# Patient Record
Sex: Male | Born: 1995 | Race: White | Hispanic: No | Marital: Single | Smoking: Never smoker
Health system: Southern US, Community
[De-identification: ages and names within clinical notes are randomized; demographics above are authoritative.]

## PROBLEM LIST (undated history)

## (undated) DIAGNOSIS — K219 Gastro-esophageal reflux disease without esophagitis: Secondary | ICD-10-CM

## (undated) HISTORY — PX: TONSILLECTOMY AND ADENOIDECTOMY: SUR1326

## (undated) HISTORY — PX: MOUTH SURGERY: SHX715

## (undated) HISTORY — DX: Gastro-esophageal reflux disease without esophagitis: K21.9

## (undated) HISTORY — PX: ROTATOR CUFF REPAIR: SHX139

---
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
## 2014-01-17 ENCOUNTER — Other Ambulatory Visit: Payer: Self-pay | Admitting: Family Medicine

## 2014-01-18 NOTE — Telephone Encounter (Signed)
Last seen 07/05/13  B Oxford

## 2014-01-29 ENCOUNTER — Ambulatory Visit (INDEPENDENT_AMBULATORY_CARE_PROVIDER_SITE_OTHER): Payer: Commercial Indemnity | Admitting: Family Medicine

## 2014-01-29 VITALS — BP 110/67 | HR 55 | Temp 99.3°F | Ht 66.0 in | Wt 228.0 lb

## 2014-01-29 DIAGNOSIS — L0390 Cellulitis, unspecified: Secondary | ICD-10-CM

## 2014-01-29 DIAGNOSIS — L0291 Cutaneous abscess, unspecified: Secondary | ICD-10-CM

## 2014-01-29 MED ORDER — DOXYCYCLINE HYCLATE 100 MG PO TABS: 100.0000 mg | ORAL_TABLET | Freq: Two times a day (BID) | ORAL | Status: DC

## 2014-01-29 NOTE — Progress Notes (Signed)
   Subjective:    Patient ID: Delories HeinzMatthew D Coppock, male    DOB: 08/16/1996, 18 y.o.   MRN: [id number]  HPI  This 18 y.o. male presents for evaluation of cyst or acne on right face.  Review of Systems No chest pain, SOB, HA, dizziness, vision change, N/V, diarrhea, constipation, dysuria, urinary urgency or frequency, myalgias, arthralgias or rash.     Objective:   Physical Exam   Vital signs noted  Well developed well nourished male.  HEENT - Head atraumatic Normocephalic                Eyes - PERRLA, Conjuctiva - clear Sclera- Clear EOMI                Ears - EAC's Wnl TM's Wnl Gross Hearing WNL                Throat - oropharanx wnl Respiratory - Lungs CTA bilateral Cardiac - RRR S1 and S2 without murmur Skin - cystic acne right face     Assessment & Plan:  Cellulitis - Plan: doxycycline (VIBRA-TABS) 100 MG tablet one po bid x 10 days #20  Deatra CanterWilliam J Oxford FNP

## 2014-03-01 ENCOUNTER — Other Ambulatory Visit: Payer: Self-pay | Admitting: Family Medicine

## 2014-06-05 ENCOUNTER — Other Ambulatory Visit: Payer: Self-pay | Admitting: Family Medicine

## 2014-07-01 ENCOUNTER — Other Ambulatory Visit: Payer: Self-pay | Admitting: Family Medicine

## 2014-07-02 ENCOUNTER — Other Ambulatory Visit: Payer: Self-pay | Admitting: *Deleted

## 2014-07-02 MED ORDER — OMEPRAZOLE 20 MG PO CPDR: DELAYED_RELEASE_CAPSULE | ORAL | Status: DC

## 2014-07-31 ENCOUNTER — Other Ambulatory Visit: Payer: Self-pay | Admitting: Family Medicine

## 2015-04-25 ENCOUNTER — Ambulatory Visit (INDEPENDENT_AMBULATORY_CARE_PROVIDER_SITE_OTHER): Payer: Commercial Indemnity | Admitting: Physician Assistant

## 2015-04-25 ENCOUNTER — Encounter: Payer: Self-pay | Admitting: Physician Assistant

## 2015-04-25 ENCOUNTER — Ambulatory Visit (INDEPENDENT_AMBULATORY_CARE_PROVIDER_SITE_OTHER): Payer: Commercial Indemnity

## 2015-04-25 VITALS — Temp 97.5°F | Ht 66.0 in | Wt 214.6 lb

## 2015-04-25 DIAGNOSIS — M79672 Pain in left foot: Secondary | ICD-10-CM | POA: Diagnosis not present

## 2015-04-25 DIAGNOSIS — S99922A Unspecified injury of left foot, initial encounter: Secondary | ICD-10-CM | POA: Diagnosis not present

## 2015-04-25 MED ORDER — MELOXICAM 7.5 MG PO TABS: 7.5000 mg | ORAL_TABLET | Freq: Every day | ORAL | Status: DC

## 2015-04-25 NOTE — Patient Instructions (Signed)
Foot Contusion ° A foot contusion is a deep bruise to the foot. Contusions happen when an injury causes bleeding under the skin. Signs of bruising include pain, puffiness (swelling), and discolored skin. The contusion may turn blue, purple, or yellow. °HOME CARE °· Put ice on the injured area. °¨ Put ice in a plastic bag. °¨ Place a towel between your skin and the bag. °¨ Leave the ice on for 15-20 minutes, 03-04 times a day. °· Only take medicines as told by your doctor. °· Use an elastic wrap only as told. You may remove the wrap for sleeping, showering, and bathing. Take the wrap off if you lose feeling (numb) in your toes, or they turn blue or cold. Put the wrap on more loosely. °· Keep the foot raised (elevated) with pillows. °· If your foot hurts, avoid standing or walking. °· When your doctor says it is okay to use your foot, start using it slowly. If you have pain, lessen how much you use your foot. °· See your doctor as told. °GET HELP RIGHT AWAY IF:  °· You have more redness, puffiness, or pain in your foot. °· Your puffiness or pain does not get better with medicine. °· You lose feeling in your foot, or you cannot move your toes. °· Your foot turns cold or blue. °· You have pain when you move your toes. °· Your foot feels warm. °· Your contusion does not get better in 2 days. °MAKE SURE YOU:  °· Understand these instructions. °· Will watch this condition. °· Will get help right away if you or your child is not doing well or gets worse. °Document Released: 06/23/2008 Document Revised: 03/15/2012 Document Reviewed: 08/18/2011 °ExitCare® Patient Information ©2015 [employer]. This information is not intended to replace advice given to you by your health care provider. Make sure you discuss any questions you have with your health care provider. ° °

## 2015-04-25 NOTE — Progress Notes (Signed)
   Subjective:    Patient ID: Alfred Andrews, male    DOB: 1996/07/11, 19 y.o.   MRN: [id number]  HPI 19 y/o male presents with left foot pain x 1 day s/p hitting his foot with a softball last night while batting. He states that he had immediate pain and presents with crutches today. Has tried ice and ibuprofen with no relief.     Review of Systems  Constitutional: Negative.   HENT: Negative.   Eyes: Negative.   Respiratory: Negative.   Cardiovascular: Negative.   Gastrointestinal: Negative.   Musculoskeletal:       Left foot pain on top of foot , radiating to toes Pain is constant, worse with walking or applying pressure. Better with rest and elevation        Objective:   Physical Exam  Constitutional: He is oriented to person, place, and time. He appears well-developed and well-nourished. No distress.  Musculoskeletal: Normal range of motion. He exhibits tenderness (dorsal surface of left foot ). He exhibits no edema.  Neurological: He is alert and oriented to person, place, and time.  Skin: He is not diaphoretic.  Psychiatric: He has a normal mood and affect. His behavior is normal. Judgment and thought content normal.  Nursing note and vitals reviewed.         Assessment & Plan:  1. Foot injury, left, initial encounter  - DG Foot Complete Left; Future - negative for fracture or acute abnormality  - meloxicam (MOBIC) 7.5 MG tablet; Take 1 tablet (7.5 mg total) by mouth daily.  Dispense: 30 tablet; Refill: 0  2. Left foot pain - Rest, ice , compression, elevation as directed. Instructions given  - meloxicam (MOBIC) 7.5 MG tablet; Take 1 tablet (7.5 mg total) by mouth daily.  Dispense: 30 tablet; Refill: 0   RTC in 1-2 weeks if no improvement  Markez Dowland A. Chauncey Reading PA-C

## 2018-05-20 ENCOUNTER — Emergency Department
Admission: EM | Admit: 2018-05-20 | Discharge: 2018-05-20 | Disposition: A | Discharge status: Home / Self Care | Payer: 59 | Source: Home / Self Care | Attending: Family Medicine | Admitting: Family Medicine

## 2018-05-20 ENCOUNTER — Encounter: Payer: Self-pay | Admitting: *Deleted

## 2018-05-20 DIAGNOSIS — B353 Tinea pedis: Secondary | ICD-10-CM

## 2018-05-20 DIAGNOSIS — G44209 Tension-type headache, unspecified, not intractable: Secondary | ICD-10-CM

## 2018-05-20 MED ORDER — CYCLOBENZAPRINE HCL 5 MG PO TABS: 5.0000 mg | ORAL_TABLET | Freq: Two times a day (BID) | ORAL | 0 refills | Status: DC | PRN

## 2018-05-20 MED ORDER — MICONAZOLE NITRATE 2 % EX POWD: CUTANEOUS | 0 refills | Status: DC | PRN

## 2018-05-20 NOTE — ED Triage Notes (Signed)
Patient c/o 1 month of HA, neck stiffness and upper back/shoulder pain. Using icy hot, IBF and Excedrin and stretching.  He does recall hitting his head when standing from a squatted position at work about 1 month ago.

## 2018-05-20 NOTE — ED Provider Notes (Signed)
[facility]    CSN: [id number] Arrival date & time: 05/20/18  1602     History   Chief Complaint Chief Complaint  Patient presents with  . Headache  . Neck Pain    HPI Alfred Andrews is a 22 y.o. male.   HPI  Alfred Andrews is a 22 y.o. male presenting to UC with c/o intermittent headache and constant neck and mid to upper back pain and stiffness for the last 1 month. He recalls hitting his head when standing from a squatting position at work when pain started. Denies LOC. Pain is aching and sore, 3/10 at this time. He has tried IcyHot, ibuprofen, Tylenol, Excedrin and stretching with temporary relief.  Denies dizziness, nausea or change in vision. No weakness or numbness in arms or legs. Denies heavy lifting at work but he started current job a few months ago and stands on his feet most of the day. No personal hx of migraines but family hx of migraines.    Pt also c/o persistent itching red rash on his feet the last few weeks. He has been treating his athlete's foot with OTC creams with mild relief. He notes his shoes/feet get wet often at work. No other rashes.   Past Medical History:  Diagnosis Date  . GERD (gastroesophageal reflux disease)     There are no active problems to display for this patient.   Past Surgical History:  Procedure Laterality Date  . MOUTH SURGERY    . ROTATOR CUFF REPAIR    . TONSILLECTOMY AND ADENOIDECTOMY         Home Medications    Prior to Admission medications   Medication Sig Start Date End Date Taking? Authorizing Provider  cyclobenzaprine (FLEXERIL) 5 MG tablet Take 1-2 tablets (5-10 mg total) by mouth 2 (two) times daily as needed for muscle spasms. 05/20/18   Lurene Shadow, PA-C  miconazole (MICOTIN) 2 % powder Apply topically as needed for itching. 05/20/18   Lurene Shadow, PA-C  omeprazole (PRILOSEC OTC) 20 MG tablet Take 1 tablet (20 mg total) by mouth daily. 06/16/13 11/08/13  Floydene Flock, MD    Family  History Family History  Problem Relation Age of Onset  . Asthma Mother   . Hypertension Father     Social History Social History   Tobacco Use  . Smoking status: Never Smoker  . Smokeless tobacco: Never Used  Substance Use Topics  . Alcohol use: No  . Drug use: No     Allergies   Patient has no known allergies.   Review of Systems Review of Systems  Constitutional: Negative for chills and fever.  Gastrointestinal: Negative for nausea and vomiting.  Musculoskeletal: Positive for back pain, myalgias, neck pain and neck stiffness.  Skin: Positive for color change and rash. Negative for wound.  Neurological: Positive for headaches. Negative for dizziness, weakness, light-headedness and numbness.     Physical Exam Triage Vital Signs ED Triage Vitals  Enc Vitals Group     BP 05/20/18 1628 123/82     Pulse Rate 05/20/18 1628 (!) 51     Resp 05/20/18 1628 14     Temp --      Temp src --      SpO2 05/20/18 1628 98 %     Weight 05/20/18 1629 238 lb (108 kg)     Height 05/20/18 1629 5\' 8"  (1.727 m)     Head Circumference --      Peak  Flow --      Pain Score 05/20/18 1629 3     Pain Loc --      Pain Edu? --      Excl. in GC? --    No data found.  Updated Vital Signs BP 123/82 (BP Location: Right Arm)   Pulse (!) 51   Resp 14   Ht 5\' 8"  (1.727 m)   Wt 238 lb (108 kg)   SpO2 98%   BMI 36.19 kg/m   Visual Acuity Right Eye Distance:   Left Eye Distance:   Bilateral Distance:    Right Eye Near:   Left Eye Near:    Bilateral Near:     Physical Exam  Constitutional: He is oriented to person, place, and time. He appears well-developed and well-nourished.  Non-toxic appearance. He does not appear ill. No distress.  HENT:  Head: Normocephalic and atraumatic.  Right Ear: Tympanic membrane normal.  Left Ear: Tympanic membrane normal.  Nose: Nose normal.  Mouth/Throat: Uvula is midline, oropharynx is clear and moist and mucous membranes are normal.  Eyes:  Pupils are equal, round, and reactive to light. EOM are normal.  Neck: Normal range of motion. Neck supple.  No midline spinal tenderness. Tenderness to bilateral cervical muscles, Left greater than Right. No nuchal rigidity or meningeal signs.  Cardiovascular: Regular rhythm. Bradycardia present.  Pulmonary/Chest: Effort normal and breath sounds normal.  Musculoskeletal: Normal range of motion. He exhibits tenderness. He exhibits no edema.  No midline spinal tenderness. Tenderness to bilateral thoracic muscles and upper trapezius.  Neurological: He is alert and oriented to person, place, and time. He has normal strength. He displays a negative Romberg sign. Coordination and gait normal. GCS eye subscore is 4. GCS verbal subscore is 5. GCS motor subscore is 6.  Skin: Skin is warm and dry. Rash noted.  Bilateral feet: erythematous macular rash around and bettern toes on both feet. Non-tender.   Psychiatric: He has a normal mood and affect. His behavior is normal.  Nursing note and vitals reviewed.    UC Treatments / Results  Labs (all labs ordered are listed, but only abnormal results are displayed) Labs Reviewed - No data to display  EKG None  Radiology No results found.  Procedures Procedures (including critical care time)  Medications Ordered in UC Medications - No data to display  Initial Impression / Assessment and Plan / UC Course  I have reviewed the triage vital signs and the nursing notes.  Pertinent labs & imaging results that were available during my care of the patient were reviewed by me and considered in my medical decision making (see chart for details).     Hx and exam c/w tension headaches secondary to back and neck muscle spasms.  Normal neuro exam  Rash c/w athletes foot. Will treat with antifungal powder.  Home info packet provided Final Clinical Impressions(s) / UC Diagnoses   Final diagnoses:  Tension headache  Tinea pedis of both feet      Discharge Instructions      Flexeril (cyclobenzaprine) is a muscle relaxer and may cause drowsiness. Do not drink alcohol, drive, or operate heavy machinery while taking.  Please follow up with your family medicine provider in 1 week if not improving, sooner if worsening.      ED Prescriptions    Medication Sig Dispense Auth. Provider   cyclobenzaprine (FLEXERIL) 5 MG tablet Take 1-2 tablets (5-10 mg total) by mouth 2 (two) times daily as needed for muscle  spasms. 30 tablet Doroteo Glassman, Jiovanny Burdell O, PA-C   miconazole (MICOTIN) 2 % powder Apply topically as needed for itching. 70 g Lurene Shadow, PA-C     Controlled Substance Prescriptions [place] Controlled Substance Registry consulted? Not Applicable   Rolla Plate 05/20/18 1658

## 2018-05-20 NOTE — Discharge Instructions (Addendum)
°  Flexeril (cyclobenzaprine) is a muscle relaxer and may cause drowsiness. Do not drink alcohol, drive, or operate heavy machinery while taking.  Please follow up with your family medicine provider in 1 week if not improving, sooner if worsening.

## 2018-05-21 ENCOUNTER — Telehealth: Payer: Self-pay | Admitting: Emergency Medicine

## 2018-05-21 MED ORDER — PREDNISONE 20 MG PO TABS: ORAL_TABLET | ORAL | 0 refills | Status: DC

## 2018-05-21 NOTE — Telephone Encounter (Signed)
Prednisone sent to pharmacy to help with back muscle strain.

## 2018-10-07 ENCOUNTER — Emergency Department (INDEPENDENT_AMBULATORY_CARE_PROVIDER_SITE_OTHER): Payer: 59

## 2018-10-07 ENCOUNTER — Emergency Department
Admission: EM | Admit: 2018-10-07 | Discharge: 2018-10-07 | Disposition: A | Discharge status: Home / Self Care | Payer: 59 | Source: Home / Self Care

## 2018-10-07 ENCOUNTER — Other Ambulatory Visit: Payer: Self-pay

## 2018-10-07 DIAGNOSIS — M546 Pain in thoracic spine: Secondary | ICD-10-CM

## 2018-10-07 DIAGNOSIS — G8929 Other chronic pain: Secondary | ICD-10-CM | POA: Diagnosis not present

## 2018-10-07 DIAGNOSIS — M542 Cervicalgia: Secondary | ICD-10-CM

## 2018-10-07 DIAGNOSIS — M62830 Muscle spasm of back: Secondary | ICD-10-CM

## 2018-10-07 DIAGNOSIS — M549 Dorsalgia, unspecified: Secondary | ICD-10-CM

## 2018-10-07 MED ORDER — PREDNISONE 20 MG PO TABS: ORAL_TABLET | ORAL | 0 refills | Status: AC

## 2018-10-07 MED ORDER — METHOCARBAMOL 500 MG PO TABS: 500.0000 mg | ORAL_TABLET | Freq: Two times a day (BID) | ORAL | 0 refills | Status: AC

## 2018-10-07 NOTE — ED Triage Notes (Signed)
Pt presents today with c/o neck pain 4/10 dull and nagging. States that he has been having the same problem since August 2019 when he was prescribed flexeril and deltasone. Currently he is taking ibuprofen which alleviates the pain for a short period of time. He also has tried ice and heat therapy at home. VSS he has not been immunized for Influenza this season.

## 2018-10-07 NOTE — ED Provider Notes (Signed)
[facility]    CSN: [id number] Arrival date & time: 10/07/18  1332     History   Chief Complaint Chief Complaint  Patient presents with  . Neck Pain    HPI Alfred Andrews is a 23 y.o. male.   HPI Alfred Andrews is a 22 y.o. male presenting to UC with c/o chronic neck and mid back pain that started in August 2019. No known injury. He was tx with Flexeril and prednisone with mild relief temporary relief.  He has used ibuprofen, ice and heat at home with mild temporary relief as well.  Denies worsening of symptoms. Denies weakness or numbness in arms or legs. Denies hx of neck or back problems prior to August 2019.    Past Medical History:  Diagnosis Date  . GERD (gastroesophageal reflux disease)     There are no active problems to display for this patient.   Past Surgical History:  Procedure Laterality Date  . MOUTH SURGERY    . ROTATOR CUFF REPAIR    . TONSILLECTOMY AND ADENOIDECTOMY         Home Medications    Prior to Admission medications   Medication Sig Start Date End Date Taking? Authorizing Provider  methocarbamol (ROBAXIN) 500 MG tablet Take 1 tablet (500 mg total) by mouth 2 (two) times daily. 10/07/18   Lurene Shadow, PA-C  predniSONE (DELTASONE) 20 MG tablet 3 tabs po day one, then 2 po daily x 4 days 10/07/18   Lurene Shadow, PA-C  omeprazole (PRILOSEC OTC) 20 MG tablet Take 1 tablet (20 mg total) by mouth daily. 06/16/13 11/08/13  Floydene Flock, MD    Family History Family History  Problem Relation Age of Onset  . Asthma Mother   . Hypertension Father     Social History Social History   Tobacco Use  . Smoking status: Never Smoker  . Smokeless tobacco: Never Used  Substance Use Topics  . Alcohol use: No  . Drug use: No     Allergies   Patient has no known allergies.   Review of Systems Review of Systems  Constitutional: Negative for appetite change, chills, fatigue and fever.  Musculoskeletal: Positive for  arthralgias and myalgias.  Neurological: Negative for weakness and numbness.     Physical Exam Triage Vital Signs ED Triage Vitals  Enc Vitals Group     BP      Pulse      Resp      Temp      Temp src      SpO2      Weight      Height      Head Circumference      Peak Flow      Pain Score      Pain Loc      Pain Edu?      Excl. in GC?    No data found.  Updated Vital Signs BP 120/77 (BP Location: Right Arm)   Pulse 76   Temp 98.1 F (36.7 C) (Oral)   Resp 20   Ht 5\' 8"  (1.727 m)   Wt 242 lb 1.9 oz (109.8 kg)   SpO2 96%   BMI 36.81 kg/m   Visual Acuity Right Eye Distance:   Left Eye Distance:   Bilateral Distance:    Right Eye Near:   Left Eye Near:    Bilateral Near:     Physical Exam Vitals signs and nursing note reviewed.  Constitutional:  Appearance: He is well-developed.  HENT:     Head: Normocephalic and atraumatic.  Neck:     Musculoskeletal: Normal range of motion.     Comments: Mild cervical tenderness. Full ROM Cardiovascular:     Rate and Rhythm: Normal rate.  Pulmonary:     Effort: Pulmonary effort is normal.  Musculoskeletal: Normal range of motion.        General: Tenderness present.     Comments: Diffuse upper and mid back tenderness.  Full ROM upper and lower extremities bilaterally.   Skin:    General: Skin is warm and dry.  Neurological:     Mental Status: He is alert and oriented to person, place, and time.  Psychiatric:        Behavior: Behavior normal.      UC Treatments / Results  Labs (all labs ordered are listed, but only abnormal results are displayed) Labs Reviewed - No data to display  EKG None  Radiology Dg Cervical Spine Complete  Result Date: 10/07/2018 CLINICAL DATA:  Posterior neck pain and tightness. Pain for 5-6 months. Pain extends into the left shoulder. EXAM: CERVICAL SPINE - COMPLETE 4+ VIEW COMPARISON:  None. FINDINGS: There straightening of the normal cervical lordosis. Prevertebral soft  tissues are within normal limits. Vertebral body heights are normal. No focal stenosis is evident. Lung apices are clear. IMPRESSION: 1. Straightening of the normal cervical lordosis. This is nonspecific, but can be seen in the setting of muscle strain or ongoing pain. 2. No other acute or focal abnormality. Electronically Signed   By: Marin Roberts M.D.   On: 10/07/2018 14:57   Dg Thoracic Spine W/swimmers  Result Date: 10/07/2018 CLINICAL DATA:  Back pain and tightness for 5-6 months. EXAM: THORACIC SPINE - 3 VIEWS COMPARISON:  None. FINDINGS: Twelve rib-bearing thoracic type vertebral bodies are present. Vertebral body heights and alignment are maintained. No acute or healing fractures are present. Paraspinous soft tissues are within normal limits. IMPRESSION: Negative thoracic spine radiographs. Electronically Signed   By: Marin Roberts M.D.   On: 10/07/2018 14:58    Procedures Procedures (including critical care time)  Medications Ordered in UC Medications - No data to display  Initial Impression / Assessment and Plan / UC Course  I have reviewed the triage vital signs and the nursing notes.  Pertinent labs & imaging results that were available during my care of the patient were reviewed by me and considered in my medical decision making (see chart for details).     Reviewed imaging with pt Will have pt try Robaxin instead of flexeril Encouraged f/u with Sports Medicine  Final Clinical Impressions(s) / UC Diagnoses   Final diagnoses:  Chronic neck pain  Chronic mid back pain  Spasm of back muscles     Discharge Instructions      Robaxin (methocarbamol) is a muscle relaxer and may cause drowsiness. Do not drink alcohol, drive, or operate heavy machinery while taking.  Please call to schedule a follow up appointment with Sports Medicine next week for further evaluation and treatment of chronic neck and back pain. You may need a referral to a physical therapist.      ED Prescriptions    Medication Sig Dispense Auth. Provider   methocarbamol (ROBAXIN) 500 MG tablet Take 1 tablet (500 mg total) by mouth 2 (two) times daily. 20 tablet Doroteo Glassman, Marianela Mandrell O, PA-C   predniSONE (DELTASONE) 20 MG tablet 3 tabs po day one, then 2 po daily x 4 days 11  tablet Lurene ShadowPhelps, Erikah Thumm O, PA-C     Controlled Substance Prescriptions [place] Controlled Substance Registry consulted? Not Applicable   Rolla Platehelps, Elyce Zollinger O, PA-C 10/07/18 1554

## 2018-10-07 NOTE — Discharge Instructions (Signed)
°  Robaxin (methocarbamol) is a muscle relaxer and may cause drowsiness. Do not drink alcohol, drive, or operate heavy machinery while taking.  Please call to schedule a follow up appointment with Sports Medicine next week for further evaluation and treatment of chronic neck and back pain. You may need a referral to a physical therapist.

## 2020-08-25 IMAGING — DX DG THORACIC SPINE 3V
3 series · 3 of 3 positions shown · non-contrast
Comparison: None.

CLINICAL DATA: Back pain and tightness for 5-6 months.

EXAM:
THORACIC SPINE - 3 VIEWS

[t-spine ap]
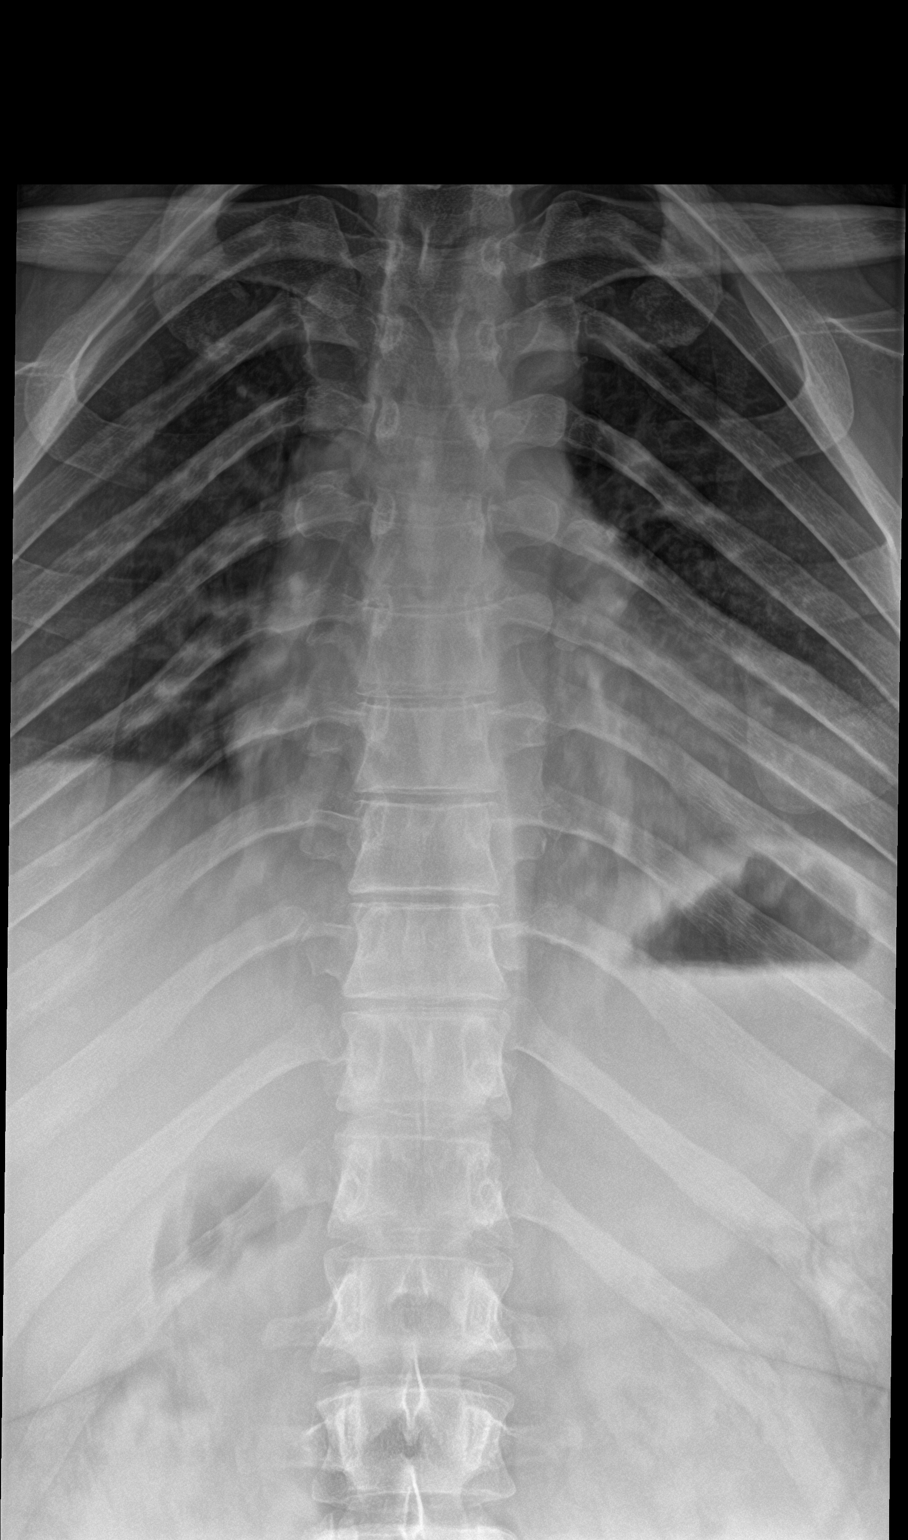

[t-spine lat]
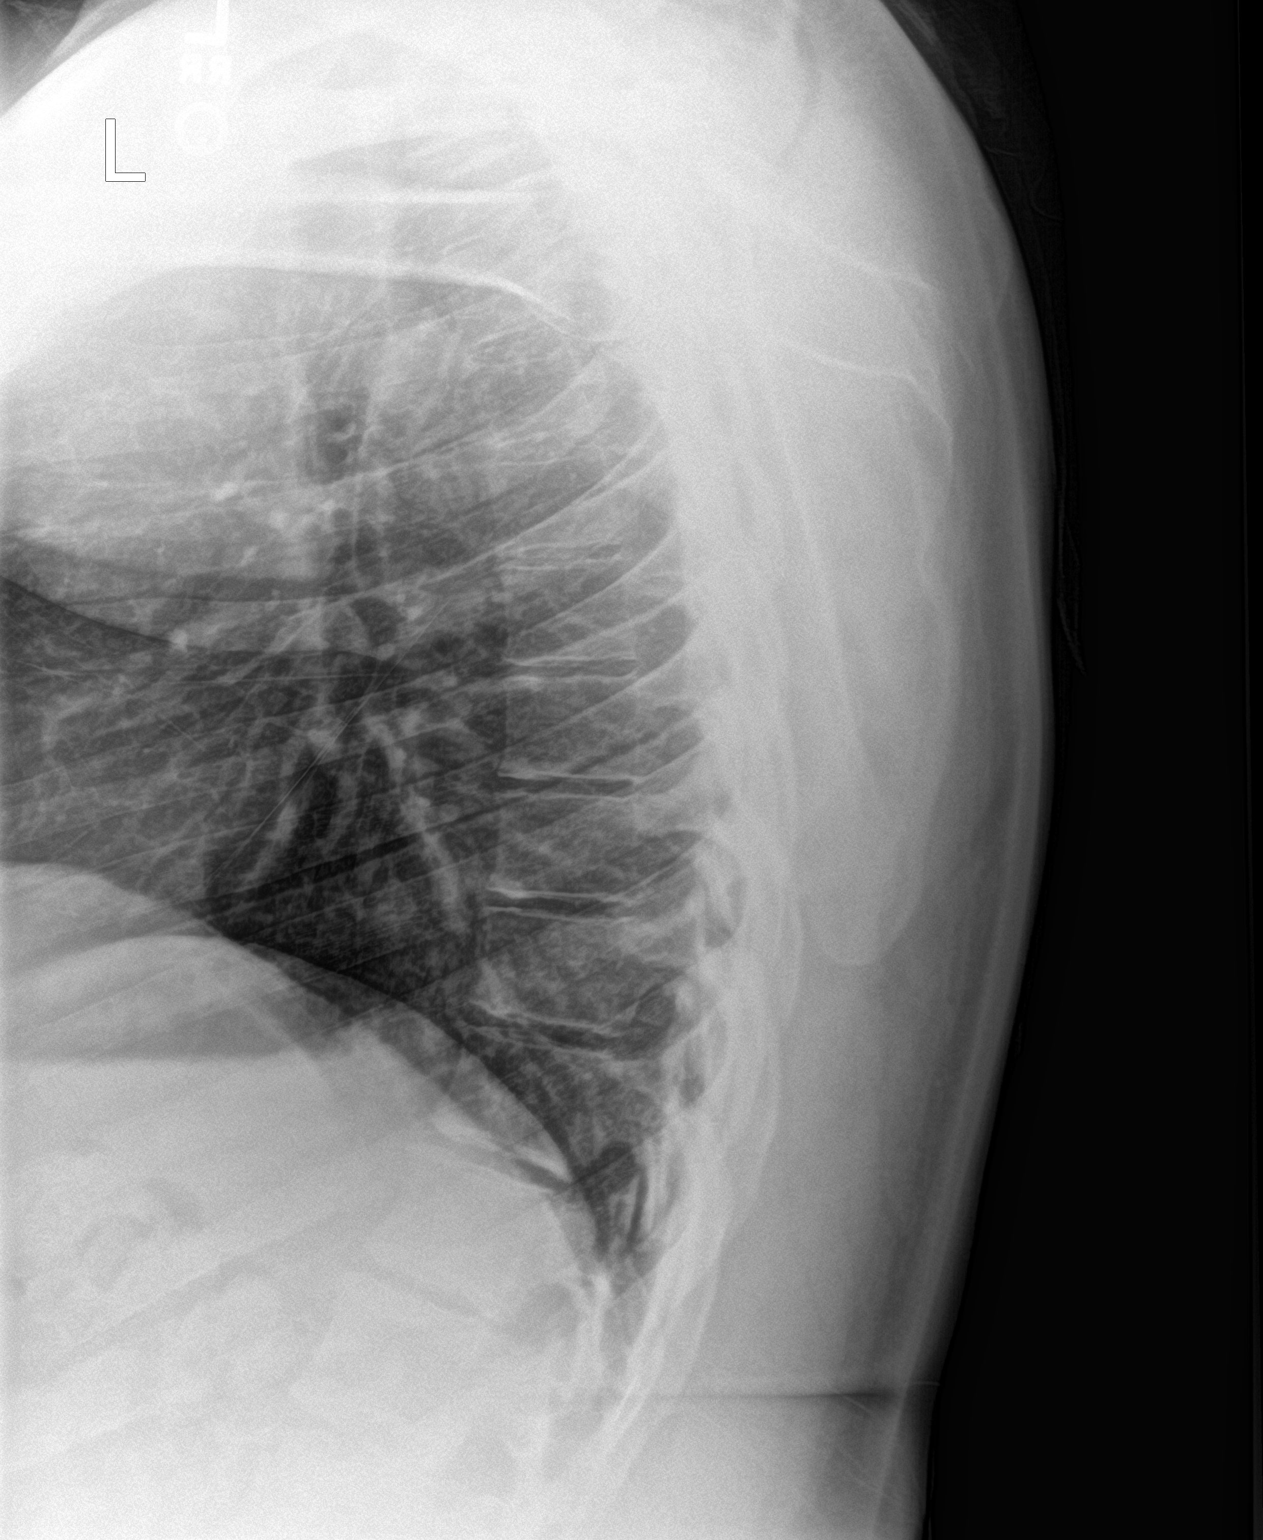

[t-spine swimmers]
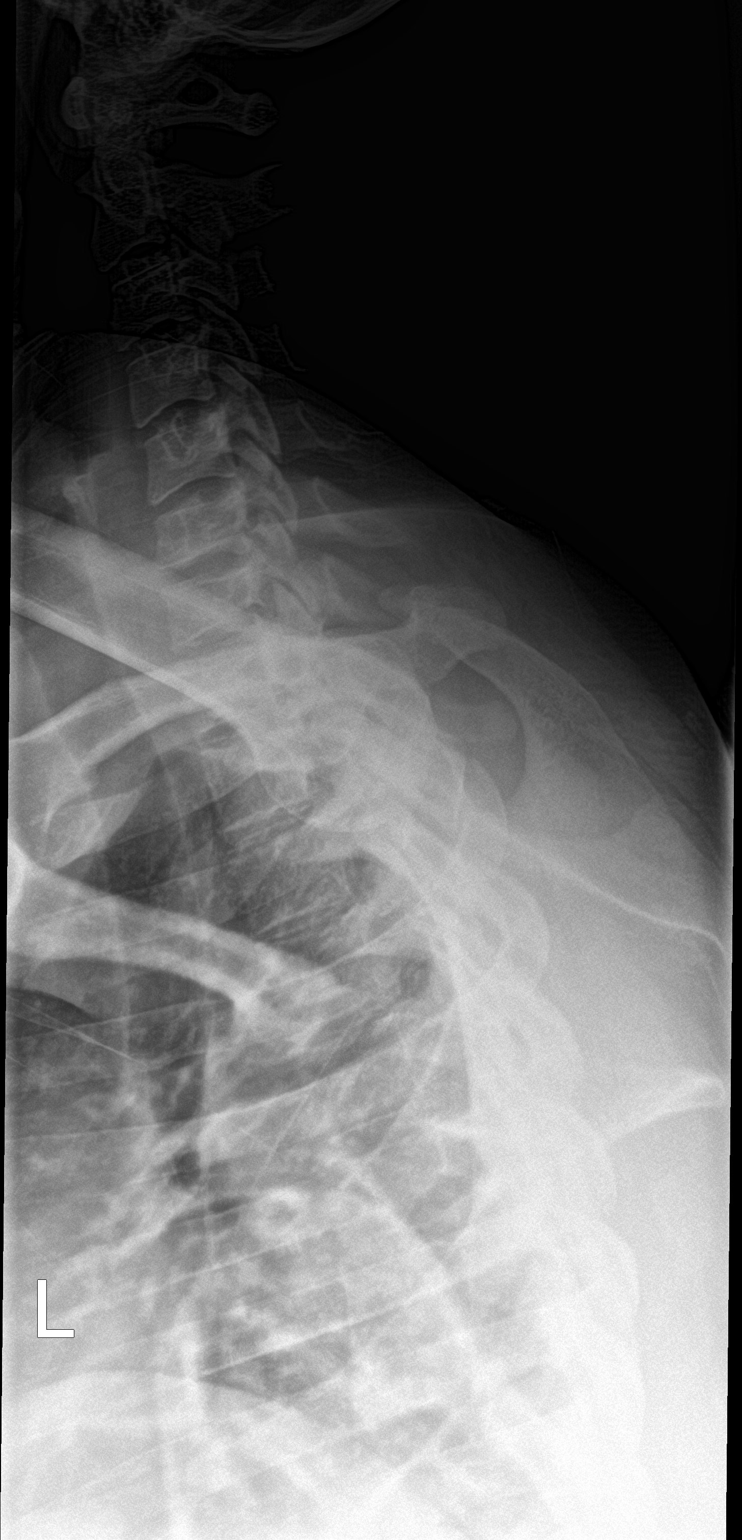

[3 of 3 positions shown; findings below may reference images not displayed]

FINDINGS: Twelve rib-bearing thoracic type vertebral bodies are present.
Vertebral body heights and alignment are maintained. No acute or
healing fractures are present. Paraspinous soft tissues are within
normal limits.
IMPRESSION: Negative thoracic spine radiographs.
